# Patient Record
Sex: Female | Born: 1965 | Race: White | Hispanic: No | Marital: Married | Smoking: Current every day smoker
Health system: Southern US, Community
[De-identification: ages and names within clinical notes are randomized; demographics above are authoritative.]

## PROBLEM LIST (undated history)

## (undated) DIAGNOSIS — G4730 Sleep apnea, unspecified: Secondary | ICD-10-CM

## (undated) DIAGNOSIS — E079 Disorder of thyroid, unspecified: Secondary | ICD-10-CM

## (undated) HISTORY — PX: CARDIAC CATHETERIZATION: SHX172

## (undated) HISTORY — PX: FOOT SURGERY: SHX648

## (undated) HISTORY — DX: Sleep apnea, unspecified: G47.30

## (undated) HISTORY — DX: Disorder of thyroid, unspecified: E07.9

---

## 1997-09-16 ENCOUNTER — Ambulatory Visit (HOSPITAL_COMMUNITY): Admission: RE | Admit: 1997-09-16 | Discharge: 1997-09-16 | Payer: Self-pay | Admitting: Obstetrics and Gynecology

## 2000-12-12 ENCOUNTER — Encounter (INDEPENDENT_AMBULATORY_CARE_PROVIDER_SITE_OTHER): Payer: Self-pay

## 2000-12-12 ENCOUNTER — Ambulatory Visit (HOSPITAL_COMMUNITY): Admission: RE | Admit: 2000-12-12 | Discharge: 2000-12-12 | Payer: Self-pay | Admitting: Obstetrics and Gynecology

## 2001-07-21 ENCOUNTER — Inpatient Hospital Stay (HOSPITAL_COMMUNITY): Admission: AD | Admit: 2001-07-21 | Discharge: 2001-07-21 | Payer: Self-pay | Admitting: Obstetrics and Gynecology

## 2001-12-01 ENCOUNTER — Other Ambulatory Visit: Admission: RE | Admit: 2001-12-01 | Discharge: 2001-12-01 | Payer: Self-pay | Admitting: Obstetrics and Gynecology

## 2003-04-18 ENCOUNTER — Other Ambulatory Visit: Admission: RE | Admit: 2003-04-18 | Discharge: 2003-04-18 | Payer: Self-pay | Admitting: Obstetrics and Gynecology

## 2005-10-01 ENCOUNTER — Other Ambulatory Visit: Admission: RE | Admit: 2005-10-01 | Discharge: 2005-10-01 | Payer: Self-pay | Admitting: Obstetrics and Gynecology

## 2006-05-19 ENCOUNTER — Encounter: Admission: RE | Admit: 2006-05-19 | Discharge: 2006-05-19 | Payer: Self-pay | Admitting: Family Medicine

## 2006-06-11 ENCOUNTER — Encounter: Admission: RE | Admit: 2006-06-11 | Discharge: 2006-06-11 | Payer: Self-pay | Admitting: Obstetrics and Gynecology

## 2006-11-21 ENCOUNTER — Other Ambulatory Visit: Admission: RE | Admit: 2006-11-21 | Discharge: 2006-11-21 | Payer: Self-pay | Admitting: Obstetrics and Gynecology

## 2006-12-16 ENCOUNTER — Ambulatory Visit (HOSPITAL_COMMUNITY): Admission: RE | Admit: 2006-12-16 | Discharge: 2006-12-16 | Payer: Self-pay | Admitting: Cardiovascular Disease

## 2007-11-24 ENCOUNTER — Other Ambulatory Visit: Admission: RE | Admit: 2007-11-24 | Discharge: 2007-11-24 | Payer: Self-pay | Admitting: Obstetrics and Gynecology

## 2008-01-07 ENCOUNTER — Encounter: Admission: RE | Admit: 2008-01-07 | Discharge: 2008-01-07 | Payer: Self-pay | Admitting: Cardiology

## 2009-01-10 ENCOUNTER — Other Ambulatory Visit: Admission: RE | Admit: 2009-01-10 | Discharge: 2009-01-10 | Payer: Self-pay | Admitting: Obstetrics and Gynecology

## 2009-01-13 ENCOUNTER — Encounter: Admission: RE | Admit: 2009-01-13 | Discharge: 2009-01-13 | Payer: Self-pay | Admitting: Obstetrics and Gynecology

## 2010-08-15 ENCOUNTER — Ambulatory Visit: Payer: Self-pay | Admitting: Cardiology

## 2010-08-22 ENCOUNTER — Ambulatory Visit: Payer: Self-pay | Admitting: Cardiology

## 2010-09-24 ENCOUNTER — Inpatient Hospital Stay (HOSPITAL_COMMUNITY)
Admission: AD | Admit: 2010-09-24 | Discharge: 2010-09-24 | Disposition: A | Discharge status: Home / Self Care | Payer: 59 | Source: Ambulatory Visit | Attending: Obstetrics & Gynecology | Admitting: Obstetrics & Gynecology

## 2010-09-24 ENCOUNTER — Inpatient Hospital Stay (HOSPITAL_COMMUNITY): Payer: 59

## 2010-09-24 DIAGNOSIS — N949 Unspecified condition associated with female genital organs and menstrual cycle: Secondary | ICD-10-CM | POA: Insufficient documentation

## 2010-09-24 DIAGNOSIS — N938 Other specified abnormal uterine and vaginal bleeding: Secondary | ICD-10-CM | POA: Insufficient documentation

## 2010-09-24 DIAGNOSIS — N898 Other specified noninflammatory disorders of vagina: Secondary | ICD-10-CM

## 2010-09-24 LAB — URINALYSIS, ROUTINE W REFLEX MICROSCOPIC
Ketones, ur: 15 mg/dL — AB
Nitrite: POSITIVE — AB
Specific Gravity, Urine: <1.005 — ABNORMAL LOW (ref 1.005–1.030)
Urobilinogen, UA: 0.2 mg/dL (ref 0.0–1.0)
pH: 5 (ref 5.0–8.0)

## 2010-09-24 LAB — CBC
HCT: 37.7 % (ref 36.0–46.0)
MCH: 30.9 pg (ref 26.0–34.0)
MCV: 94.7 fL (ref 78.0–100.0)
Platelets: 273 10*3/uL (ref 150–400)
RDW: 14 % (ref 11.5–15.5)
WBC: 8.1 10*3/uL (ref 4.0–10.5)

## 2010-09-24 LAB — POCT PREGNANCY, URINE: Preg Test, Ur: NEGATIVE

## 2010-09-24 LAB — URINE MICROSCOPIC-ADD ON

## 2010-09-24 LAB — WET PREP, GENITAL
Trich, Wet Prep: NONE SEEN
Yeast Wet Prep HPF POC: NONE SEEN

## 2010-09-25 LAB — GC/CHLAMYDIA PROBE AMP, GENITAL
Chlamydia, DNA Probe: NEGATIVE
GC Probe Amp, Genital: NEGATIVE

## 2010-09-26 ENCOUNTER — Telehealth: Payer: Self-pay | Admitting: Cardiology

## 2010-09-26 NOTE — Telephone Encounter (Signed)
Pt called, testing system-encounter.

## 2010-09-27 ENCOUNTER — Other Ambulatory Visit: Payer: Self-pay | Admitting: Cardiology

## 2010-09-27 MED ORDER — LEVOTHYROXINE SODIUM 25 MCG PO TABS: 25.0000 ug | ORAL_TABLET | Freq: Every day | ORAL | Status: DC

## 2010-09-27 NOTE — Telephone Encounter (Signed)
Patient requested refill

## 2010-09-27 NOTE — Telephone Encounter (Signed)
Patient needs refill of [facility]roid [facility].  Please update script for #90 day supply.

## 2010-11-07 ENCOUNTER — Telehealth: Payer: Self-pay | Admitting: Cardiology

## 2010-11-07 NOTE — Telephone Encounter (Signed)
test

## 2010-11-13 NOTE — Cardiovascular Report (Signed)
NAMEERRICA, DUTIL NO.:  [id number]   MEDICAL RECORD NO.:  [id number]          PATIENT TYPE:  OIB   LOCATION:  2864                         FACILITY:  [facility]   PHYSICIAN:  Vesta Mixer, M.D. DATE OF BIRTH:  [birth year]   DATE OF PROCEDURE:  12/16/2006  DATE OF DISCHARGE:  12/16/2006                            CARDIAC CATHETERIZATION   Kayla Graham is a 45 year old female who started having chest pain  this morning.  She had some subtle EKG changes.  She has a history of  dyslipidemia, a remote history of cigarette smoking, and a strong family  history of coronary disease.  She is referred for heart catheterization,  based on these findings.   The right femoral artery was easily cannulated, using the modified  Seldinger technique.   HEMODYNAMIC RESULTS:  The LV pressure is 121/15 with an aortic pressure  122/83.   ANGIOGRAPHY:  Left main.  The left main is extremely short but is very  large and normal.   The left anterior descending artery is fairly normal.  It is a moderate-  sized vessel.  It is fairly smooth and normal throughout its course.   The first diagonal artery is fairly large and is otherwise normal.   The left circumflex artery is large and dominant.  It gives off a large  first obtuse marginal artery, which is normal.  The second obtuse  marginal artery/posterolateral branch is normal.  The terminal  circumflex artery and posterior descending artery are normal.   The right coronary artery is small and is non-dominant.  It supplies RV  marginal branches only.  It is smooth and normal.   The left ventriculogram was performed in a 30 RAO position.  It reveals  normal left ventricular systolic function with an ejection fraction of  65%.  There is no mitral regurgitation.   COMPLICATIONS:  None.   CONCLUSIONS:  1. Smooth and normal coronary arteries.  2. Normal left ventricular systolic function.     ______________________________  Vesta Mixer, M.D.     PJN/MEDQ  D:  01/20/2007  T:  01/20/2007  Job:  [id number]

## 2010-11-13 NOTE — H&P (Signed)
NAMESHATERRA, SANZONE NO.:  [id number]   MEDICAL RECORD NO.:  [id number]           PATIENT TYPE:   LOCATION:                                 FACILITY:   PHYSICIAN:  Vesta Mixer, M.D. DATE OF BIRTH:  02-Apr-1966   DATE OF ADMISSION:  12/16/2006  DATE OF DISCHARGE:                              HISTORY & PHYSICAL   HISTORY AND PHYSICAL   SUBJECTIVE:  (Jelena)__________Lester is a 45 year old female who is  admitted to the hospital for episodes of chest pain.  She is scheduled  for heart catheterization.   The patient has a long history of low HCL and dyslipidemia.  She has a  very strong family history of coronary artery disease with her mother  having a myocardial infarction and coronary artery bypass grafting in  her 24's.  She also has a history of cigarette smoking.  She started  having some chest pains this morning.  This chest pain started while she  was carrying a load of laundry up the stairs.  The pain was 5/10 and  radiated through to her back and down her left arm.  It was associated  also with some left arm numbness as well as some diaphoresis and some  shortness of breath.  It lasted for about an hour or so and then seemed  to resolve.  We gave her a nitroglycerin, but it is not clear whether  the pain resolved spontaneously or with the nitroglycerin.  She states  that she has been having these pains off and on for many years.  She has  thought that they may be due to esophageal reflux or other GI etiology.  She does not get any regular exercise.  She states that these typically  occur at various times, but does note that exercise seems to bring them  on fairly often.  She denies any syncope or presyncope.  She denies any  PND or orthopnea.  She denies any hemoptysis, cough or fever.   CURRENT MEDICATIONS:  None.   ALLERGIES:  None.   PAST MEDICAL HISTORY:  None.   SOCIAL HISTORY:  The patient used to smoke but quit 1 1/2 years ago.  She  does not drink alcohol.   FAMILY HISTORY:  Strongly positive for coronary artery disease in her  mother.   REVIEW OF SYSTEMS:  Review of systems is reviewed and is essentially  negative except for as noted in the HPI.   PHYSICAL EXAMINATION:  GENERAL:  She is a young White female in no acute  distress.  She does complain of having some mild 4/10 chest pain.  Her  weight is 218.  VITAL SIGNS:  Blood pressure initially was 154/100.  Several minutes  later it was 132/90.  Her heart rate is 80.  HEENT EXAM:  Reveals 2+ carotid.  She has no bruits, no JVD, no  thyromegaly.  LUNGS:  Clear to auscultation.  HEART:  Regular rate S1, S2.  ABDOMINAL EXAM:  Reveals good bowel sounds and is nontender.  EXTREMITIES:  She has no clubbing, cyanosis or edema.  NEURO  EXAM:  Nonfocal.  Her femoral pulses are somewhat deep.  Her  distal pulses are normal.   Her EKG reveals normal sinus rhythm.  She has nonspecific ST and T-wave  changes.   IMPRESSION AND PLAN:  Chest pain.  The patient has multiple risk factors  for coronary artery disease.  Her mother had significant coronary artery  disease in  her 40's.  In addition, she has low HCL and history of  cigarette smoking.  I have recommended that we proceed with heart  catheterization for further evaluation.  We have discussed the risks and  benefits and options of heart catheterization.  She understands and  agrees to proceed.   Other possible etiologies include a hiatal hernia or gastroesophageal  reflux disease.  My preference would be to rule out any sort of cardiac  disease and then proceed with a GI work-up if indicated.  The patient is  now pain free after receiving nitroglycerin.  We will schedule her for a  heart catheterization today.           ______________________________  Vesta Mixer, M.D.     PJN/MEDQ  D:  12/16/2006  T:  12/16/2006  Job:  [id number]

## 2010-11-16 NOTE — Op Note (Signed)
[facility]  Patient:    Kayla Graham, Kayla Graham                      MRN: [id number] Proc. Date: 12/12/00 Adm. Date:  [id number] Attending:  Leonard Schwartz                           Operative Report  PREOPERATIVE DIAGNOSIS:       Irregular bleeding.  POSTOPERATIVE DIAGNOSES:      1. Irregular bleeding.                               2. Uterine polyps.  PROCEDURE:                    Diagnostic hysteroscopy with dilatation and                               curettage.  SURGEON:                      Janine Limbo, M.D.  ANESTHESIA:                   General.  DISPOSITION:                  [name] is a 45 year old female who presents with irregular bleeding. She understands the indications for her procedure and she accepts the risk of, but not limited to, anesthetic complications, bleeding, infections, and possible damage to the surrounding organs.  FINDINGS:                     A moderate amount of endometrial tissue was present. Polyps were noted within the endometrial cavity. The postoperative cavity was clean.  ESTIMATED BLOOD LOSS:         30 cc.  DESCRIPTION OF PROCEDURE:     The patient was taken to the operating room where a general anesthetic was given. The patients perineum and vagina were prepped with multiple layers of Betadine. The bladder was drained of urine. The patient was sterilely draped. Examination under anesthesia was performed. A paracervical block was placed using 10 cc of 0.5% Marcaine without epinephrine and endocervical curettage was obtained. The cervix was sounded to 9 cm. The cervix was then gradually dilated. The uterine cavity was explored using the diagnostic hysteroscope. Pictures were taken. The hysteroscope was removed and the cavity was curetted until it was felt to be clean. The hysteroscope was reinserted and the cavity was then documented to be clean. All instruments were removed. The estimated  blood loss was approximately 30 cc. The patient tolerated her procedure well. The deficit was Sorbitol was thought to be zero. The patient was awakened from her anesthetic and taken to the recovery room in stable condition.  FOLLOWUP INSTRUCTIONS:          The patient will return to see Dr. Stefano Gaul in two to three weeks for followup examination. She was given a prescription for Darvocet-N 100, one tablet every 10 to 12 hours as needed for pain. She will return sooner should she have questions or concerns. She was given a copy of the postoperative instruction sheet as prepared by the [facility] for patients who have undergone a dilatation and curettage. DD:  12/12/00 TD:  12/12/00 Job: [id number]

## 2010-11-16 NOTE — H&P (Signed)
[facility]  Patient:    Kayla Graham, Kayla Graham                        MRN: [id number] Adm. Date:  12/12/00 Attending:  Janine Limbo, M.D.                         History and Physical  HISTORY OF PRESENT ILLNESS:   The patient is a 45 year old female, para 2-0-0-3, who presents for hysteroscopy and D&C because of irregular vaginal bleeding. We attempted a hydrosonogram and this was unsuccessful. The patient had an ultrasound performed, and there was a question of an endometrial polyp. The endometrium was thickened at 1.8 cm. The uterus measured 11.2 x 5.3 x 6 cm. The patient has had an abnormal Pap smear in the past. She denies a history of sexually-transmitted infections. She has had a cesarean section in the past.  OBSTETRICAL HISTORY:          The patient has had twins and one vaginal delivery.  DRUG ALLERGIES:               None known.  PAST MEDICAL HISTORY:         The patient has a history of anemia during pregnancy. She has taken Prozac in the past for depression. She denies other major medical illnesses.  SOCIAL HISTORY:               The patient drinks alcohol socially. She denies cigarette use and other recreational drug uses.  REVIEW OF SYSTEMS:            Noncontributory.  FAMILY HISTORY:               The patients mother has hypertension and heart disease.  PHYSICAL EXAMINATION:  VITAL SIGNS:                  Weight is 186 pounds.  HEENT:                        Within normal limits.  CHEST:                        Clear.  HEART:                        Regular rate and rhythm.  BREASTS:                      Without masses.  ABDOMEN:                      Nontender.  EXTREMITIES:                  Within normal limits.  NEUROLOGICAL:                 Grossly normal.  PELVIC:                       External genitalia is normal. The vagina is normal. Cervix is nontender. The uterus is upper limit of normal size. Adnexa no  masses.  ASSESSMENT:                   Irregular vaginal bleeding.  PLAN:  The patient will undergo a hysteroscopy and dilatation and curettage. She understands the indications for her procedure, and she accepts the risks of, but not limited to, anesthetic complications, bleeding, infections, and possible damage to the surrounding organs. DD:  12/11/00 TD:  12/11/00 Job: [id number]

## 2010-11-16 NOTE — H&P (Signed)
NAMEEMMELY, BITTINGER NO.:  [id number]   MEDICAL RECORD NO.:  [id number]          PATIENT TYPE:  OIB   LOCATION:  2864                         FACILITY:  [facility]   PHYSICIAN:  Vesta Mixer, M.D. DATE OF BIRTH:  06/28/1966   DATE OF ADMISSION:  12/16/2006  DATE OF DISCHARGE:                              HISTORY & PHYSICAL   HISTORY OF PRESENT ILLNESS:  Kayla Graham is a 45 year old female with  onset of chest pain this morning.  She has a history of dyslipidemia  with a low HDL.  She is admitted for heart catheterization for further  evaluation.   The patient has a strong family history of coronary artery disease.  She  has a history of cigarette smoking in the past.   The chest pain started the morning of December 16, 2006.  She had just  brought a load of laundry up the stairs.  The chest pain was described  as a 5/10.  It lasted for several hours.  It radiated through to her  back  and was associated with some shortness breath.  There was also  some question of radiation up to her neck and down her arm.   She got several nitroglycerin, and the chest eventually resolved either  spontaneously or with a nitroglycerin.   The patient has not had any similar episodes in the past.  She does not  get any regular exercise, but has not had any episodes of chest pain  with her normal activities.  She denies any syncope or presyncope.  She  denies any nausea, vomiting or diaphoresis.   CURRENT MEDICATIONS:  None.   ALLERGIES:  None.   PAST MEDICAL HISTORY:  None.   SOCIAL HISTORY:  The patient quit smoking one and a half years ago.  She  does not drink alcohol.   FAMILY HISTORY:  Positive coronary artery disease in an early age in her  mother.   REVIEW OF SYSTEMS:  Reviewed and is essentially negative except for as  noted in HPI.   PHYSICAL EXAMINATION:  GENERAL:  She is a young female.  She still has  mild chest pain.  VITAL SIGNS:  Weight is 218.  Initial  blood pressure was 154/100.  Later  was 132/90.  Heart rate 80.  LUNGS:  Clear.  HEART:  Regular rate S1-S2.  ABDOMEN:  Reveals good bowel sounds and is nontender.  EXTREMITIES:  She has no clubbing, cyanosis or edema.  She has good  pulses.  NEUROLOGICAL:  Nonfocal.   STUDIES:  Her EKG reveals normal sinus rhythm.  She has nonspecific ST-T  wave abnormalities.   IMPRESSION AND PLAN:  Chest pain.  The patient presents with sudden  onset of chest pain this morning.  She has several risk factors  including dyslipidemia, history of cigarette smoking and a strong family  history of coronary  artery disease.  We have discussed the risks, benefits and options  concerning heart catheterization.  She understands and agrees to  proceed.  Other possible etiologies include hiatal hernia or esophageal  spasm.  We will proceed  with heart catheterization.           ______________________________  Vesta Mixer, M.D.     PJN/MEDQ  D:  01/20/2007  T:  01/20/2007  Job:  [id number]

## 2011-01-24 ENCOUNTER — Other Ambulatory Visit (HOSPITAL_COMMUNITY): Payer: Self-pay | Admitting: Orthopedic Surgery

## 2011-01-24 DIAGNOSIS — R52 Pain, unspecified: Secondary | ICD-10-CM

## 2011-01-25 ENCOUNTER — Ambulatory Visit (HOSPITAL_COMMUNITY)
Admission: RE | Admit: 2011-01-25 | Discharge: 2011-01-25 | Disposition: A | Discharge status: Home / Self Care | Payer: 59 | Source: Ambulatory Visit | Attending: Orthopedic Surgery | Admitting: Orthopedic Surgery

## 2011-01-25 DIAGNOSIS — M25579 Pain in unspecified ankle and joints of unspecified foot: Secondary | ICD-10-CM | POA: Insufficient documentation

## 2011-01-25 DIAGNOSIS — R609 Edema, unspecified: Secondary | ICD-10-CM | POA: Insufficient documentation

## 2011-01-25 DIAGNOSIS — R52 Pain, unspecified: Secondary | ICD-10-CM

## 2011-01-25 DIAGNOSIS — M6740 Ganglion, unspecified site: Secondary | ICD-10-CM | POA: Insufficient documentation

## 2011-02-21 ENCOUNTER — Other Ambulatory Visit: Payer: Self-pay | Admitting: *Deleted

## 2011-02-21 DIAGNOSIS — R0683 Snoring: Secondary | ICD-10-CM

## 2011-02-22 ENCOUNTER — Ambulatory Visit (HOSPITAL_BASED_OUTPATIENT_CLINIC_OR_DEPARTMENT_OTHER): Payer: 59 | Attending: Cardiology

## 2011-02-22 DIAGNOSIS — R0683 Snoring: Secondary | ICD-10-CM

## 2011-02-22 DIAGNOSIS — G4733 Obstructive sleep apnea (adult) (pediatric): Secondary | ICD-10-CM | POA: Insufficient documentation

## 2011-02-25 ENCOUNTER — Encounter: Payer: Self-pay | Admitting: *Deleted

## 2011-02-28 DIAGNOSIS — G4733 Obstructive sleep apnea (adult) (pediatric): Secondary | ICD-10-CM

## 2011-02-28 NOTE — Procedures (Signed)
NAMEAUSTIN, Kayla Graham NO.:  [id number]  MEDICAL RECORD NO.:  [id number]          PATIENT TYPE:  OUT  LOCATION:  SLEEP CENTER                 FACILITY:  [facility]  PHYSICIAN:  Barbaraann Share, MD,FCCPDATE OF BIRTH:  14-Dec-1965  DATE OF STUDY:  02/22/2011                           NOCTURNAL POLYSOMNOGRAM  REFERRING PHYSICIAN:  Cassell Clement, M.D.  INDICATION FOR STUDY:  Hypersomnia with sleep apnea.  EPWORTH SLEEPINESS SCORE:  8.  SLEEP ARCHITECTURE:  The patient had a total sleep time of 347 minutes with decreased quantity of REM and also slow wave sleep.  Sleep onset latency was normal at 4.5 minutes, and REM onset was at the upper limits of normal at 112 minutes.  Sleep efficiency was minimally reduced in both the diagnostic and titration portion of the study.  RESPIRATORY DATA:  The patient underwent split night protocol where she was found to have 56 obstructive events in the first 120 minutes of sleep.  This gave her an apnea/hypopnea index of 28 events per hour. The events occurred in all body positions, and there was loud snoring noted throughout.  She was then fitted with a medium Quattro Fx full- face mask per split night protocol, and ultimately titrated to an optimal pressure of 8 cm of water with good control of both obstructive events and snoring.  OXYGEN DATA:  There was O2 desaturation as low as 75% with the patient's obstructive events.  CARDIAC DATA:  No clinically significant arrhythmias were noted.  MOVEMENT-PARASOMNIA:  The patient had no significant leg jerks or other abnormal behavior seen.  IMPRESSION-RECOMMENDATIONS:  Split night study reveals moderate obstructive sleep apnea with an AH I of 28 events per hour and O2 desaturation as low as 75%.  The patient was then fitted with a medium Quattro FX full-face mask, and titrated to an optimal pressure of 8 cm of water.  She should also be encouraged to work aggressively on  weight loss.    Barbaraann Share, MD,FCCP Diplomate, American Board of Sleep Medicine Electronically Signed   [facility]/MEDQ  D:  02/28/2011 15:01:12  T:  02/28/2011 18:53:44  Job:  [id number]

## 2011-03-28 ENCOUNTER — Encounter: Payer: Self-pay | Admitting: Pulmonary Disease

## 2011-03-28 ENCOUNTER — Ambulatory Visit (INDEPENDENT_AMBULATORY_CARE_PROVIDER_SITE_OTHER): Payer: 59 | Admitting: Pulmonary Disease

## 2011-03-28 VITALS — BP 118/82 | HR 77 | Temp 98.5°F | Ht 65.0 in | Wt 226.6 lb

## 2011-03-28 DIAGNOSIS — G4733 Obstructive sleep apnea (adult) (pediatric): Secondary | ICD-10-CM

## 2011-03-28 NOTE — Assessment & Plan Note (Signed)
Initiate  CPAP of 8 cm with a full face quattro mask. - Expect to see some improvement in Kayla Graham daytime tiredness and snoring. Weight loss encouraged, compliance with goal of at least 4-6 hrs every night is the expectation. Advised against medications with sedative side effects Cautioned against driving when sleepy - understanding that sleepiness will vary on a day to day basis The pathophysiology of obstructive sleep apnea , it's cardiovascular consequences & modes of treatment including CPAP were discused with the patient in detail & they evidenced understanding.

## 2011-03-28 NOTE — Progress Notes (Signed)
  Subjective:    Patient ID: Kayla Graham, female    DOB: 09/30/1965, 45 y.o.   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 c/o loud snoring, husband states she stops breathing and choke in sleep. Wake up with a sore throat ESS 7/24 , her complaint is mainly tiredness. Bedtime is around 10 PM, sleep latency is 15-30 minutes, she sleeps on her side with 2 pillows. Husband has noted increased snoring when she lies on her back. She has 3-4 awakenings, gets out of bed at 5:30 AM feeling somewhat tired with occasional dryness of mouth, no headaches. She has for 45 minute drive to work, drinks 2 cups of coffee every morning and another mug on route. She has gained 15 pounds in the last 2 years. Her cardiac profile is okay. There is no history suggestive of cataplexy, sleep paralysis or parasomnias  PSG reviewed 8/12 showed moderate OSA with AHI 28/h & nadir satn of 75% TST 347 mins. This was corrected with CPAP of 8 cm with a full face quattro mask   Review of Systems  Constitutional: Negative for fever and unexpected weight change.  HENT: Positive for congestion, sore throat and sneezing. Negative for ear pain, nosebleeds, rhinorrhea, trouble swallowing, dental problem, postnasal drip and sinus pressure.   Eyes: Positive for redness and itching.  Respiratory: Positive for cough and shortness of breath. Negative for chest tightness and wheezing.   Cardiovascular: Negative for palpitations and leg swelling.  Gastrointestinal: Negative for nausea and vomiting.  Genitourinary: Negative for dysuria.  Musculoskeletal: Positive for arthralgias. Negative for joint swelling.  Skin: Negative for rash.  Neurological: Negative for headaches.  Hematological: Does not bruise/bleed easily.  Psychiatric/Behavioral: Negative for dysphoric mood. The patient is not nervous/anxious.        Objective:   Physical Exam  Gen.  Pleasant, well-nourished, in no distress ENT - no lesions, no post nasal drip Neck: No JVD, no thyromegaly, no carotid bruits Lungs: no use of accessory muscles, no dullness to percussion, clear without rales or rhonchi  Cardiovascular: Rhythm regular, heart sounds  normal, no murmurs or gallops, no peripheral edema Musculoskeletal: No deformities, no cyanosis or clubbing        Assessment & Plan:

## 2011-03-28 NOTE — Patient Instructions (Signed)
Rx sent to DME for CPAP machine set at 8 cm with full face mask Send in the card before your FU appt

## 2011-05-14 ENCOUNTER — Ambulatory Visit: Payer: 59 | Admitting: Pulmonary Disease

## 2011-06-02 ENCOUNTER — Telehealth: Payer: Self-pay | Admitting: Pulmonary Disease

## 2011-06-02 NOTE — Telephone Encounter (Signed)
Download 10/4-30/12 >> no residuall events on CPAP, no leak, good compliance Hope she is feeling better

## 2011-06-03 NOTE — Telephone Encounter (Signed)
I informed pt of RA's findings and recommendations. Pt verbalized understanding. Pt states is doing better. She and husband are both sleeping well.

## 2011-06-21 ENCOUNTER — Encounter: Payer: Self-pay | Admitting: Pulmonary Disease

## 2011-06-21 ENCOUNTER — Ambulatory Visit (INDEPENDENT_AMBULATORY_CARE_PROVIDER_SITE_OTHER): Payer: 59 | Admitting: Pulmonary Disease

## 2011-06-21 VITALS — BP 120/90 | HR 81 | Temp 98.0°F | Ht 65.0 in | Wt 233.2 lb

## 2011-06-21 DIAGNOSIS — G4733 Obstructive sleep apnea (adult) (pediatric): Secondary | ICD-10-CM

## 2011-06-21 NOTE — Progress Notes (Signed)
  Subjective:    Patient ID: Kayla Graham, female    DOB: Jan 30, 1966, 45 y.o.   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  c/o loud snoring, husband states she stops breathing and choke in sleep. Wake up with a sore throat  ESS 7/24 , her complaint is mainly tiredness.  Bedtime is around 10 PM, sleep latency is 15-30 minutes, she sleeps on her side with 2 pillows. Husband has noted increased snoring when she lies on her back. She has 3-4 awakenings, gets out of bed at 5:30 AM feeling somewhat tired with occasional dryness of mouth, no headaches. She has a 45 minute drive to work, drinks 2 cups of coffee every morning and another mug on route. She has gained 15 pounds in the last 2 years. Her cardiac profile is okay.  PSG reviewed 8/12 showed moderate OSA with AHI 28/h & nadir satn of 75% TST 347 mins. This was corrected with CPAP of 8 cm with a full face quattro mask  Download 10/4-30/12 >> no residuall events on CPAP, no leak, good compliance  06/21/2011 download 10/12 >> excellent compliance on 8cm, no residuals, no leak Mask ok, pressure ok, waking up refreshed, less tiredness, mild to no snoring per pt report from husband - tolerating cpap well     Review of Systems    Patient denies significant dyspnea,cough, hemoptysis,  chest pain, palpitations, pedal edema, orthopnea, paroxysmal nocturnal dyspnea, lightheadedness, nausea, vomiting, abdominal or  leg pains   Objective:   Physical Exam  Gen. Pleasant, well-nourished, in no distress ENT - no lesions, no post nasal drip, class 2 airway Neck: No JVD, no thyromegaly, no carotid bruits Lungs: no use of accessory muscles, no dullness to percussion, clear without rales or rhonchi  Cardiovascular: Rhythm regular, heart sounds  normal, no murmurs or gallops, no peripheral edema Musculoskeletal: No deformities, no cyanosis or clubbing         Assessment & Plan:

## 2011-06-21 NOTE — Patient Instructions (Signed)
Stay on CPAP 8 cm  You are doing fabulous

## 2011-06-22 NOTE — Assessment & Plan Note (Signed)
Tolerating cpap well with good results & compliance. Weight loss encouraged, compliance with goal of at least 4-6 hrs every night is the expectation. Advised against medications with sedative side effects Cautioned against driving when sleepy - understanding that sleepiness will vary on a day to day basis Discussed care of CPAP & supplies.

## 2011-09-12 ENCOUNTER — Other Ambulatory Visit: Payer: Self-pay | Admitting: *Deleted

## 2011-09-12 ENCOUNTER — Other Ambulatory Visit (INDEPENDENT_AMBULATORY_CARE_PROVIDER_SITE_OTHER): Payer: 59

## 2011-09-12 DIAGNOSIS — E079 Disorder of thyroid, unspecified: Secondary | ICD-10-CM

## 2011-09-13 LAB — TSH: TSH: 3.34 u[IU]/mL (ref 0.35–5.50)

## 2012-01-03 ENCOUNTER — Encounter: Payer: Self-pay | Admitting: Pulmonary Disease

## 2012-01-03 ENCOUNTER — Ambulatory Visit (INDEPENDENT_AMBULATORY_CARE_PROVIDER_SITE_OTHER): Payer: 59 | Admitting: Pulmonary Disease

## 2012-01-03 VITALS — BP 136/86 | HR 97 | Temp 97.6°F | Ht 65.0 in | Wt 231.2 lb

## 2012-01-03 DIAGNOSIS — G4733 Obstructive sleep apnea (adult) (pediatric): Secondary | ICD-10-CM

## 2012-01-03 NOTE — Progress Notes (Signed)
  Subjective:    Patient ID: Kayla Graham, female    DOB: [birth year], 46 y.o.   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   PSG reviewed 8/12 showed moderate OSA with AHI 28/h & nadir satn of 75% TST 347 mins. This was corrected with CPAP of 8 cm with a full face quattro mask  Download 10/4-30/12 >> no residuall events on CPAP, no leak, good compliance  06/21/2011  download 10/12 >> excellent compliance on 8cm, no residuals, no leak    01/03/2012 Patient states about the same as last visit. Patient states wears cpap every night 6 to 8 hours. c/o red lines on her face from new mask. states the mask is not tight.  Also states fell asleep x 1 day ago without her cpap mask on  and woke up gasping for air.  Mask ok, pressure ok, waking up refreshed, less tiredness, mild to no snoring per pt report from husband - tolerating cpap well Wt about the same   Review of Systems Patient denies significant dyspnea,cough, hemoptysis,  chest pain, palpitations, pedal edema, orthopnea, paroxysmal nocturnal dyspnea, lightheadedness, nausea, vomiting, abdominal or  leg pains      Objective:   Physical Exam  Gen. Pleasant, well-nourished, in no distress ENT - no lesions, no post nasal drip Neck: No JVD, no thyromegaly, no carotid bruits Lungs: no use of accessory muscles, no dullness to percussion, clear without rales or rhonchi  Cardiovascular: Rhythm regular, heart sounds  normal, no murmurs or gallops, no peripheral edema Musculoskeletal: No deformities, no cyanosis or clubbing        Assessment & Plan:

## 2012-01-03 NOTE — Patient Instructions (Addendum)
Trial of vaseline, change fit Change of full face mask If above does not work, trial of nasal pillow

## 2012-01-06 NOTE — Assessment & Plan Note (Signed)
ModOSa, stable & comlpiant with CPAP 8 cm Trial of vaseline, change fit Change of full face mask If above does not work, trial of nasal pillow  Weight loss encouraged, compliance with goal of at least 4-6 hrs every night is the expectation. Advised against medications with sedative side effects Cautioned against driving when sleepy - understanding that sleepiness will vary on a day to day basis

## 2012-02-21 ENCOUNTER — Encounter: Payer: Self-pay | Admitting: *Deleted

## 2012-02-21 ENCOUNTER — Ambulatory Visit (INDEPENDENT_AMBULATORY_CARE_PROVIDER_SITE_OTHER): Payer: 59 | Admitting: *Deleted

## 2012-02-21 VITALS — BP 130/81 | HR 79 | Ht 64.0 in | Wt 230.0 lb

## 2012-02-21 DIAGNOSIS — R0602 Shortness of breath: Secondary | ICD-10-CM

## 2012-02-21 DIAGNOSIS — R079 Chest pain, unspecified: Secondary | ICD-10-CM

## 2012-02-21 MED ORDER — TRAZODONE HCL 100 MG PO TABS: 100.0000 mg | ORAL_TABLET | Freq: Every evening | ORAL | Status: DC | PRN

## 2012-02-21 MED ORDER — NITROGLYCERIN 0.4 MG SL SUBL: 0.4000 mg | SUBLINGUAL_TABLET | Freq: Once | SUBLINGUAL | Status: AC

## 2012-02-21 MED ORDER — NITROGLYCERIN 0.4 MG SL SUBL: 0.4000 mg | SUBLINGUAL_TABLET | SUBLINGUAL | Status: DC | PRN

## 2012-02-21 MED ORDER — RANITIDINE HCL 150 MG PO TABS: 150.0000 mg | ORAL_TABLET | Freq: Two times a day (BID) | ORAL | Status: DC

## 2012-02-21 MED ORDER — PROPRANOLOL HCL ER 60 MG PO CP24: 60.0000 mg | ORAL_CAPSULE | Freq: Every evening | ORAL | Status: DC

## 2012-02-21 NOTE — Patient Instructions (Addendum)
   Zantac 150mg  twice a day  - OTC   Nitroglycerin as needed for severe chest pain only  Trazodone 100mg  - begin with 50mg  as needed for sleep  Inderal LA 60mg  every evening - take this until 9/5 & can discuss need to continue at that time   Keep appointment already scheduled   Off rest of the day

## 2012-02-27 ENCOUNTER — Encounter: Payer: Self-pay | Admitting: Cardiovascular Disease

## 2012-02-27 ENCOUNTER — Ambulatory Visit (INDEPENDENT_AMBULATORY_CARE_PROVIDER_SITE_OTHER): Payer: 59 | Admitting: Cardiovascular Disease

## 2012-02-27 VITALS — BP 142/80 | HR 86 | Ht 65.0 in | Wt 231.2 lb

## 2012-02-27 DIAGNOSIS — R0789 Other chest pain: Secondary | ICD-10-CM

## 2012-02-27 DIAGNOSIS — R079 Chest pain, unspecified: Secondary | ICD-10-CM

## 2012-02-27 NOTE — Assessment & Plan Note (Signed)
Kayla Graham presents today with episodes of chest tightness, left shoulder pain, left arm tingling and numbness, and some shortness of breath/inability to catch her breath. These episodes occur at various times and last for several minutes. They are not associated with exercise. They are also not associated with any specific activities such as eating, drinking, change of position    .   She thinks that they may be related to stress.    She's had a normal heart catheterization in 2008.  I agree that her episodes are likely due to stress.  She has some mild abnormalities on her ECG and I think we need to rule out CAD with a 2 day Lexiscan Myoview study.    I offered to send to to see several people that may be able to help her with her stress.  Emiliano Dyer [phone] ( psychologist) Orbie Hurst [phone] ( wellness , biofeedback nurse)  I will see her as needed.
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
## 2012-02-27 NOTE — Patient Instructions (Addendum)
Your physician has requested that you have a lexiscan myoview. For further information please visit [email or web address]. Please follow instruction sheet, as given.  Your physician wants you to follow-up as needed with Dr. Nahser. You will receive a reminder letter in the mail two months in advance. If you don't receive a letter, please call our office to schedule the follow-up appointment.  

## 2012-03-05 ENCOUNTER — Ambulatory Visit: Payer: 59 | Admitting: Nurse Practitioner

## 2012-03-05 ENCOUNTER — Other Ambulatory Visit: Payer: Self-pay | Admitting: Family Medicine

## 2012-03-05 ENCOUNTER — Ambulatory Visit (HOSPITAL_COMMUNITY)
Admission: RE | Admit: 2012-03-05 | Discharge: 2012-03-05 | Disposition: A | Discharge status: Home / Self Care | Payer: 59 | Source: Ambulatory Visit | Attending: Family Medicine | Admitting: Family Medicine

## 2012-03-05 DIAGNOSIS — R0600 Dyspnea, unspecified: Secondary | ICD-10-CM

## 2012-03-05 DIAGNOSIS — R0989 Other specified symptoms and signs involving the circulatory and respiratory systems: Secondary | ICD-10-CM | POA: Insufficient documentation

## 2012-03-05 DIAGNOSIS — R0609 Other forms of dyspnea: Secondary | ICD-10-CM | POA: Insufficient documentation

## 2012-03-09 ENCOUNTER — Other Ambulatory Visit (HOSPITAL_COMMUNITY): Payer: 59

## 2012-03-09 ENCOUNTER — Ambulatory Visit (HOSPITAL_COMMUNITY): Payer: 59 | Attending: Internal Medicine | Admitting: Radiology

## 2012-03-09 VITALS — BP 125/83 | Ht 65.0 in | Wt 231.0 lb

## 2012-03-09 DIAGNOSIS — Z87891 Personal history of nicotine dependence: Secondary | ICD-10-CM | POA: Insufficient documentation

## 2012-03-09 DIAGNOSIS — R079 Chest pain, unspecified: Secondary | ICD-10-CM | POA: Insufficient documentation

## 2012-03-09 DIAGNOSIS — R0602 Shortness of breath: Secondary | ICD-10-CM

## 2012-03-09 DIAGNOSIS — R0609 Other forms of dyspnea: Secondary | ICD-10-CM | POA: Insufficient documentation

## 2012-03-09 DIAGNOSIS — R55 Syncope and collapse: Secondary | ICD-10-CM | POA: Insufficient documentation

## 2012-03-09 DIAGNOSIS — R42 Dizziness and giddiness: Secondary | ICD-10-CM | POA: Insufficient documentation

## 2012-03-09 DIAGNOSIS — R0989 Other specified symptoms and signs involving the circulatory and respiratory systems: Secondary | ICD-10-CM | POA: Insufficient documentation

## 2012-03-09 DIAGNOSIS — Z8249 Family history of ischemic heart disease and other diseases of the circulatory system: Secondary | ICD-10-CM | POA: Insufficient documentation

## 2012-03-09 DIAGNOSIS — I10 Essential (primary) hypertension: Secondary | ICD-10-CM | POA: Insufficient documentation

## 2012-03-09 MED ORDER — REGADENOSON 0.4 MG/5ML IV SOLN
0.4000 mg | Freq: Once | INTRAVENOUS | Status: AC
  Administered 2012-03-09: 0.4 mg via INTRAVENOUS

## 2012-03-09 MED ORDER — TECHNETIUM TC 99M SESTAMIBI GENERIC - CARDIOLITE
33.0000 | Freq: Once | INTRAVENOUS | Status: AC | PRN
  Administered 2012-03-09: 33 via INTRAVENOUS

## 2012-03-09 NOTE — Progress Notes (Deleted)
[facility] SITE 3 NUCLEAR MED [address] [place] [address] [phone]  Cardiology Nuclear Med Study  Kayla Graham is a 46 y.o. female     MRN : [id number]     DOB: [birth year]  Procedure Date: 03/09/2012  Nuclear Med Background Indication for Stress Test:  Evaluation for Ischemia History:  Abnormal EKG:8/13 Nonspecific ST abnormalities(no change since 2008) and '08 Heart Catheterization: EF= 65% and normal coronaries Cardiac Risk Factors: Family History - CAD, History of Smoking and Hypertension  Symptoms:  Chest Pain/Tightness at Rest and  with Exertion (last date of chest discomfort/tightness yesterday), Diaphoresis, Dizziness, DOE, Fatigue, Near Syncope and SOB   Nuclear Pre-Procedure Caffeine/Decaff Intake:  9:00pm NPO After: 9:00pm   Lungs:  clear O2 Sat: 96% on room air. IV 0.9% NS with Angio Cath:  20g  IV Site: R Hand  IV Started by:  Cathlyn Parsons, RN  Chest Size (in):  40 Cup Size: D  Height: 5\' 5"  (1.651 m)  Weight:  231 lb (104.781 kg)  BMI:  Body mass index is 38.44 kg/(m^2). Tech Comments:  Inderal held x 36 hrs    Nuclear Med Study 1 or 2 day study: 2 day  Stress Test Type:  Treadmill/Lexiscan  Reading MD: Dietrich Pates, MD  Order Authorizing Provider:  Jannette Spanner  Resting Radionuclide: Technetium 50m Sestamibi  Resting Radionuclide Dose: *** mCi   Stress Radionuclide:  Technetium 26m Sestamibi  Stress Radionuclide Dose: *** mCi           Stress Protocol Rest HR: 67 Stress HR: 101  Rest BP: 125/83 Stress BP: 134/93  Exercise Time (min): 2:00 METS: 1.6   Predicted Max HR: 174 bpm % Max HR: 58.05 bpm Rate Pressure Product: 21308   Dose of Adenosine (mg):  n/a Dose of Lexiscan: 0.4 mg  Dose of Atropine (mg): n/a Dose of Dobutamine: n/a mcg/kg/min (at max HR)  Stress Test Technologist: Cathlyn Parsons, RN  Nuclear Technologist:  {CHL LB NUCLEAR TECHNOLOGIST:21021025}     Rest Procedure:  Myocardial perfusion imaging  was performed at rest 45 minutes following the intravenous administration of Technetium 57m Sestamibi Rest ECG: Sinus Rhythm  Stress Procedure:  The patient received IV Lexiscan 0.4 mg over 15-seconds with concurrent low level exercise and then Technetium 75m Sestamibi was injected at 30-seconds while the patient continued walking one more minute. Patient had SOB with infusion.There were no significant changes with Lexiscan. Quantitative spect images were obtained after a 45-minute delay. Stress ECG: {CHL CAR STRESS ECG:21561}  QPS Raw Data Images:  {CHL RAW DATA IMAGES NUC:21021029} Stress Images:  {CHL STRESS IMAGES NUC:21021030} Rest Images:  {CHL REST IMAGES NUC:21021031} Subtraction (SDS):  {CHL SUBTRACTION (SDS) NUC:21021032} Transient Ischemic Dilatation (Normal <1.22):  *** Lung/Heart Ratio (Normal <0.45):  ***  Quantitative Gated Spect Images QGS EDV:  *** ml QGS ESV:  *** ml  Impression Exercise Capacity:  {CHL EXERCISE CAPACITY NUC:21021037} BP Response:  {CHL BP RESPONSE NUC:21021038} Clinical Symptoms:  {CHL CLINICAL SYMPTOMS NUC:21021039} ECG Impression:  {CHL ECG IMPRESSION NUC:21021040} Comparison with Prior Nuclear Study: No images to compare  Overall Impression:  {CHL OVERALL IMPRESSION NUC:21021041}  LV Ejection Fraction: {CHL CARD STUDY NOT GATED:21592:o}.  LV Wall Motion:  {CHL CARD QGS:21591:o}

## 2012-03-09 NOTE — Progress Notes (Signed)
[facility] SITE 3 NUCLEAR MED [address] [place] [address] [phone]  Cardiology Nuclear Med Study  Kayla Graham is a 46 y.o. female     MRN : [id number]     DOB: [birth year]  Procedure Date: 03/09/2012  Nuclear Med Background Indication for Stress Test:  Evaluation for Ischemia History:  Abnormal EKG:8/13 Nonspecific ST abnormalities(no change since 2008) and '08 Heart Catheterization: EF= 65% and normal coronaries Cardiac Risk Factors: Family History - CAD, History of Smoking and Hypertension  Symptoms:  Chest Pain/Tightness at Rest and  with Exertion (last date of chest discomfort/tightness yesterday), Diaphoresis, Dizziness, DOE, Fatigue, Near Syncope and SOB   Nuclear Pre-Procedure Caffeine/Decaff Intake:  9:00pm NPO After: 9:00pm   Lungs:  clear O2 Sat: 96% on room air. IV 0.9% NS with Angio Cath:  20g  IV Site: R Hand  IV Started by:  Cathlyn Parsons, RN  Chest Size (in):  40 Cup Size: D  Height: 5\' 5"  (1.651 m)  Weight:  231 lb (104.781 kg)  BMI:  Body mass index is 38.44 kg/(m^2). Tech Comments:  Inderal held x 36 hrs    Nuclear Med Study 1 or 2 day study: 2 day  Stress Test Type:  Treadmill/Lexiscan  Reading MD: Tinnie Gens Edgerrin Correia,M.D. Order Authorizing Provider:  Jannette Spanner  Resting Radionuclide: Technetium 39m Sestamibi  Resting Radionuclide Dose: 33.0 mCi  On      03-11-12  Stress Radionuclide:  Technetium 88m Sestamibi  Stress Radionuclide Dose: 33.0 mCi  On        03-09-12          Stress Protocol Rest HR: 67 Stress HR: 101  Rest BP: 125/83 Stress BP: 134/93  Exercise Time (min): 2:00 METS: 1.6   Predicted Max HR: 174 bpm % Max HR: 58.05 bpm Rate Pressure Product: 21308   Dose of Adenosine (mg):  n/a Dose of Lexiscan: 0.4 mg  Dose of Atropine (mg): n/a Dose of Dobutamine: n/a mcg/kg/min (at max HR)  Stress Test Technologist: Cathlyn Parsons, RN  Nuclear Technologist:  Domenic Polite, CNMT     Rest Procedure:   Myocardial perfusion imaging was performed at rest 45 minutes following the intravenous administration of Technetium 31m Sestamibi Rest ECG: Sinus Rhythm  Stress Procedure:  The patient received IV Lexiscan 0.4 mg over 15-seconds with concurrent low level exercise and then Technetium 23m Sestamibi was injected at 30-seconds while the patient continued walking one more minute. Patient had SOB with infusion.There were no significant changes with Lexiscan. Quantitative spect images were obtained after a 45-minute delay. Stress ECG: No significant ST segment change suggestive of ischemia.  QPS Raw Data Images:  Normal; no motion artifact; normal heart/lung ratio. Stress Images:  Normal homogeneous uptake in all areas of the myocardium. Rest Images:  Normal homogeneous uptake in all areas of the myocardium. Subtraction (SDS):  No evidence of ischemia. Transient Ischemic Dilatation (Normal <1.22):  1.10 Lung/Heart Ratio (Normal <0.45):  0.24  Quantitative Gated Spect Images QGS EDV:  90 ml QGS ESV:  25 ml  Impression Exercise Capacity:  Lexiscan with low level exercise. BP Response:  Normal blood pressure response. Clinical Symptoms:  shortness of breath ECG Impression:  No significant ST segment change suggestive of ischemia. Comparison with Prior Nuclear Study: No images to compare  Overall Impression:  Normal stress nuclear study.  LV Ejection Fraction: 72%.  LV Wall Motion:  Normal Wall Motion  Willa Rough, MD

## 2012-03-11 ENCOUNTER — Ambulatory Visit (HOSPITAL_COMMUNITY): Payer: 59 | Attending: Cardiology | Admitting: Radiology

## 2012-03-11 DIAGNOSIS — R0989 Other specified symptoms and signs involving the circulatory and respiratory systems: Secondary | ICD-10-CM

## 2012-03-11 MED ORDER — TECHNETIUM TC 99M SESTAMIBI GENERIC - CARDIOLITE
33.0000 | Freq: Once | INTRAVENOUS | Status: AC | PRN
  Administered 2012-03-11: 33 via INTRAVENOUS

## 2012-03-12 NOTE — Addendum Note (Signed)
Addended by: Domenic Polite on: 03/12/2012 09:27 AM   Modules accepted: Orders

## 2012-03-17 ENCOUNTER — Encounter: Payer: Self-pay | Admitting: Cardiovascular Disease

## 2012-08-15 ENCOUNTER — Other Ambulatory Visit: Payer: Self-pay

## 2013-05-06 ENCOUNTER — Other Ambulatory Visit: Payer: Self-pay

## 2013-05-10 ENCOUNTER — Other Ambulatory Visit: Payer: Self-pay | Admitting: Family Medicine

## 2013-05-17 ENCOUNTER — Encounter: Payer: Self-pay | Admitting: Family Medicine

## 2013-05-20 ENCOUNTER — Other Ambulatory Visit: Payer: Self-pay | Admitting: *Deleted

## 2013-05-20 NOTE — Telephone Encounter (Signed)
The patient's pharmacy is [facility] outpatient [facility]. She made an appointment for an OV up front.

## 2013-06-01 ENCOUNTER — Other Ambulatory Visit: Payer: Self-pay | Admitting: *Deleted

## 2013-06-01 ENCOUNTER — Telehealth: Payer: Self-pay | Admitting: Family Medicine

## 2013-06-01 MED ORDER — PROPRANOLOL HCL ER 60 MG PO CP24: 60.0000 mg | ORAL_CAPSULE | Freq: Every evening | ORAL | Status: DC

## 2013-06-01 NOTE — Telephone Encounter (Signed)
Med refilled. Pt notified.

## 2013-06-01 NOTE — Telephone Encounter (Signed)
Patient says that she spoke with someone regarding getting her propanolol filled. She was told she had to make an appointment for this, which she did. But she also said she thought we were going to call in a refill for this. She is completely out.   [facility]

## 2013-06-08 ENCOUNTER — Encounter: Payer: Self-pay | Admitting: Family Medicine

## 2013-06-08 ENCOUNTER — Ambulatory Visit (INDEPENDENT_AMBULATORY_CARE_PROVIDER_SITE_OTHER): Payer: 59 | Admitting: Family Medicine

## 2013-06-08 VITALS — BP 122/88 | Ht 65.0 in | Wt 211.0 lb

## 2013-06-08 DIAGNOSIS — E039 Hypothyroidism, unspecified: Secondary | ICD-10-CM | POA: Insufficient documentation

## 2013-06-08 DIAGNOSIS — Z79899 Other long term (current) drug therapy: Secondary | ICD-10-CM

## 2013-06-08 DIAGNOSIS — Z0000 Encounter for general adult medical examination without abnormal findings: Secondary | ICD-10-CM

## 2013-06-08 MED ORDER — LEVOTHYROXINE SODIUM 25 MCG PO TABS: ORAL_TABLET | ORAL | Status: DC

## 2013-06-08 MED ORDER — PROPRANOLOL HCL ER 60 MG PO CP24: 60.0000 mg | ORAL_CAPSULE | Freq: Every evening | ORAL | Status: DC

## 2013-06-08 NOTE — Progress Notes (Signed)
   Subjective:    Patient ID: Kayla Graham, female    DOB: 06/13/1966, 47 y.o.   MRN: [id number]  HPIFollow up on medication. No concerns. Has had flu vaccine.  She denies any particular problems currently she uses trazodone for insomnia she also uses her thyroid medicine as directed. Takes propranolol extended release 4 blood pressure  Past medical history benign social history she smokes she's been counseled to quit Review of Systems She denies headaches chest pain shortness breath vomiting diarrhea fevers or chills    Objective:   Physical Exam  Lungs are clear hearts regular neck no masses pulses are normal skin warm dry      Assessment & Plan:  #1 patient was advised to quit smoking and Y. #2 blood pressure under good control but she needs to check her blood pressure with her cuff outside the office and send [place] the readings every now and then it's borderline am concerned if not in the direction it should be continue current medication #3 hypothyroidism check lab work await the results of this. Patient also encouraged to continue try to lose weight

## 2013-06-11 LAB — LIPID PANEL
HDL: 35 mg/dL — ABNORMAL LOW (ref >39)
LDL Cholesterol: 74 mg/dL (ref 0–99)
Total CHOL/HDL Ratio: 4 Ratio
Triglycerides: 151 mg/dL — ABNORMAL HIGH (ref <150)
VLDL: 30 mg/dL (ref 0–40)

## 2013-06-11 LAB — BASIC METABOLIC PANEL
CO2: 26 mEq/L (ref 19–32)
Chloride: 103 mEq/L (ref 96–112)
Creat: 0.88 mg/dL (ref 0.50–1.10)
Potassium: 4.4 mEq/L (ref 3.5–5.3)
Sodium: 137 mEq/L (ref 135–145)

## 2013-06-11 LAB — TSH: TSH: 2.261 u[IU]/mL (ref 0.350–4.500)

## 2013-06-12 IMAGING — CR DG CHEST 2V
2 series · 2 of 2 positions shown · non-contrast
Comparison: 05/19/2006

CLINICAL DATA: Dyspnea

CHEST - 2 VIEW

[view not recorded (1 of 2)]
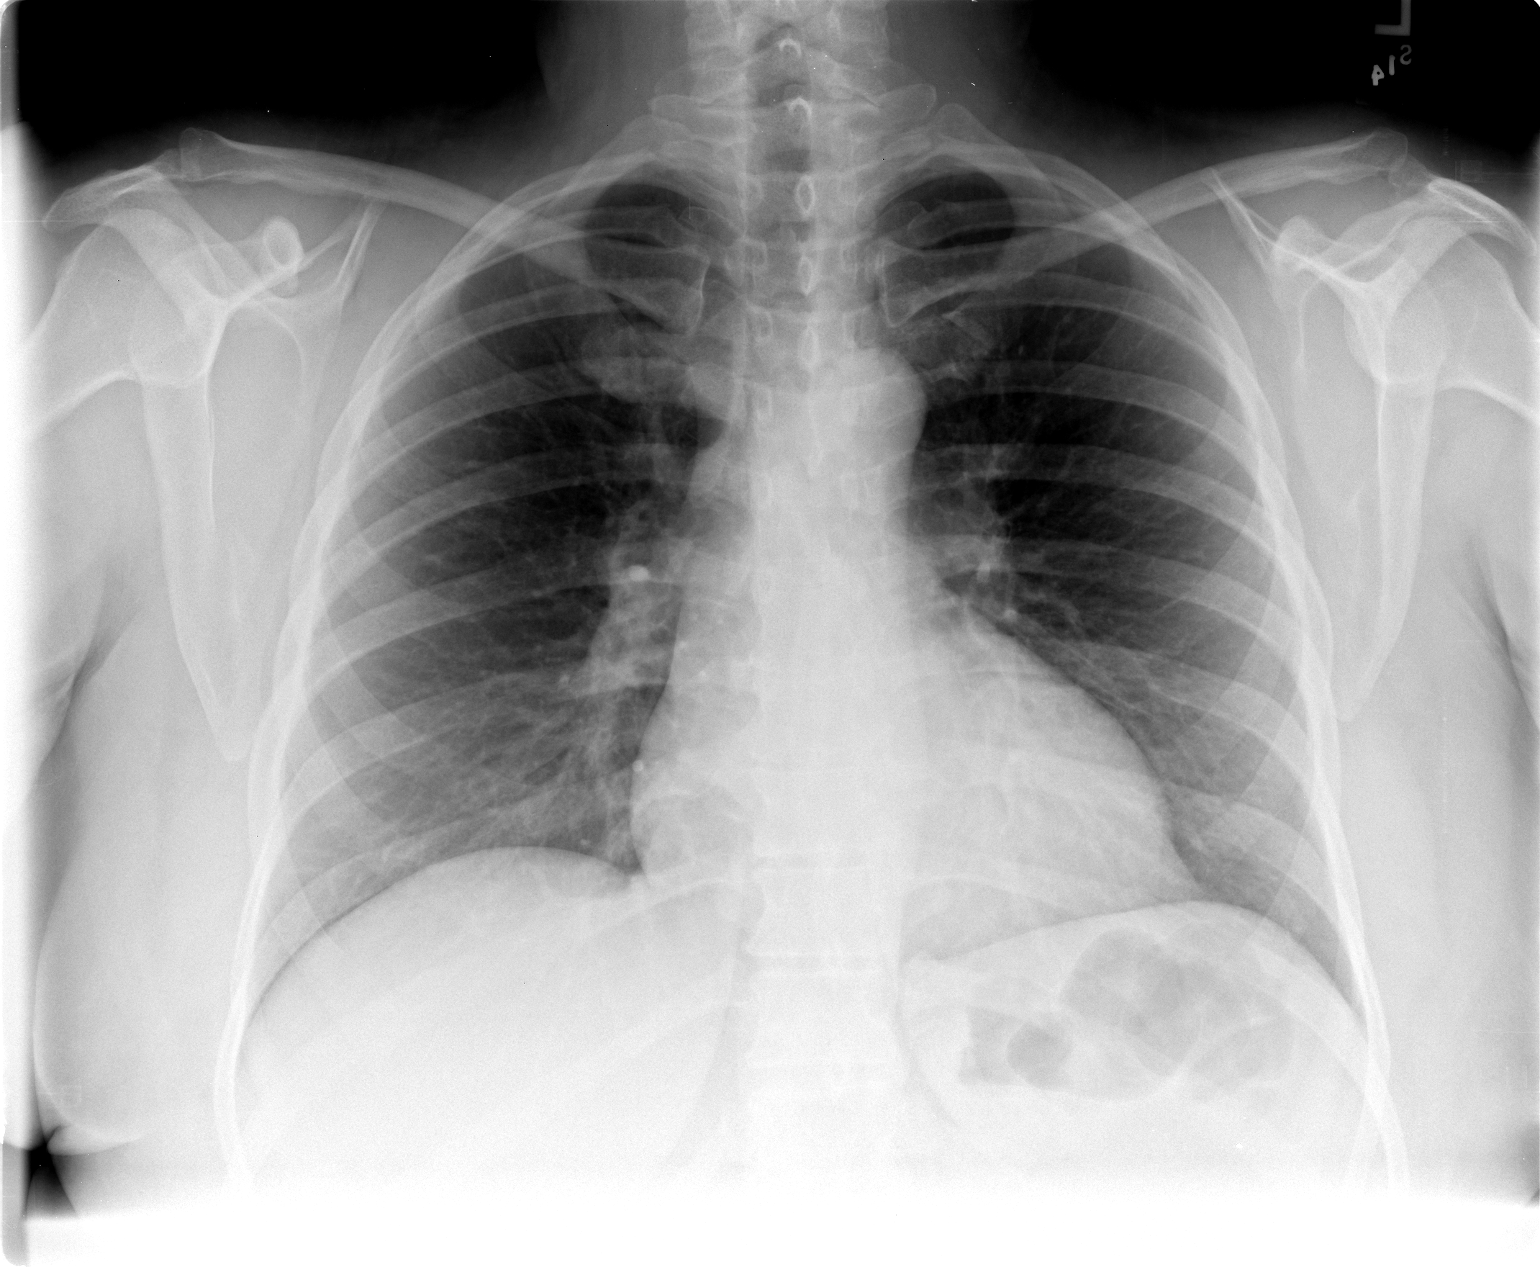

[view not recorded (2 of 2)]
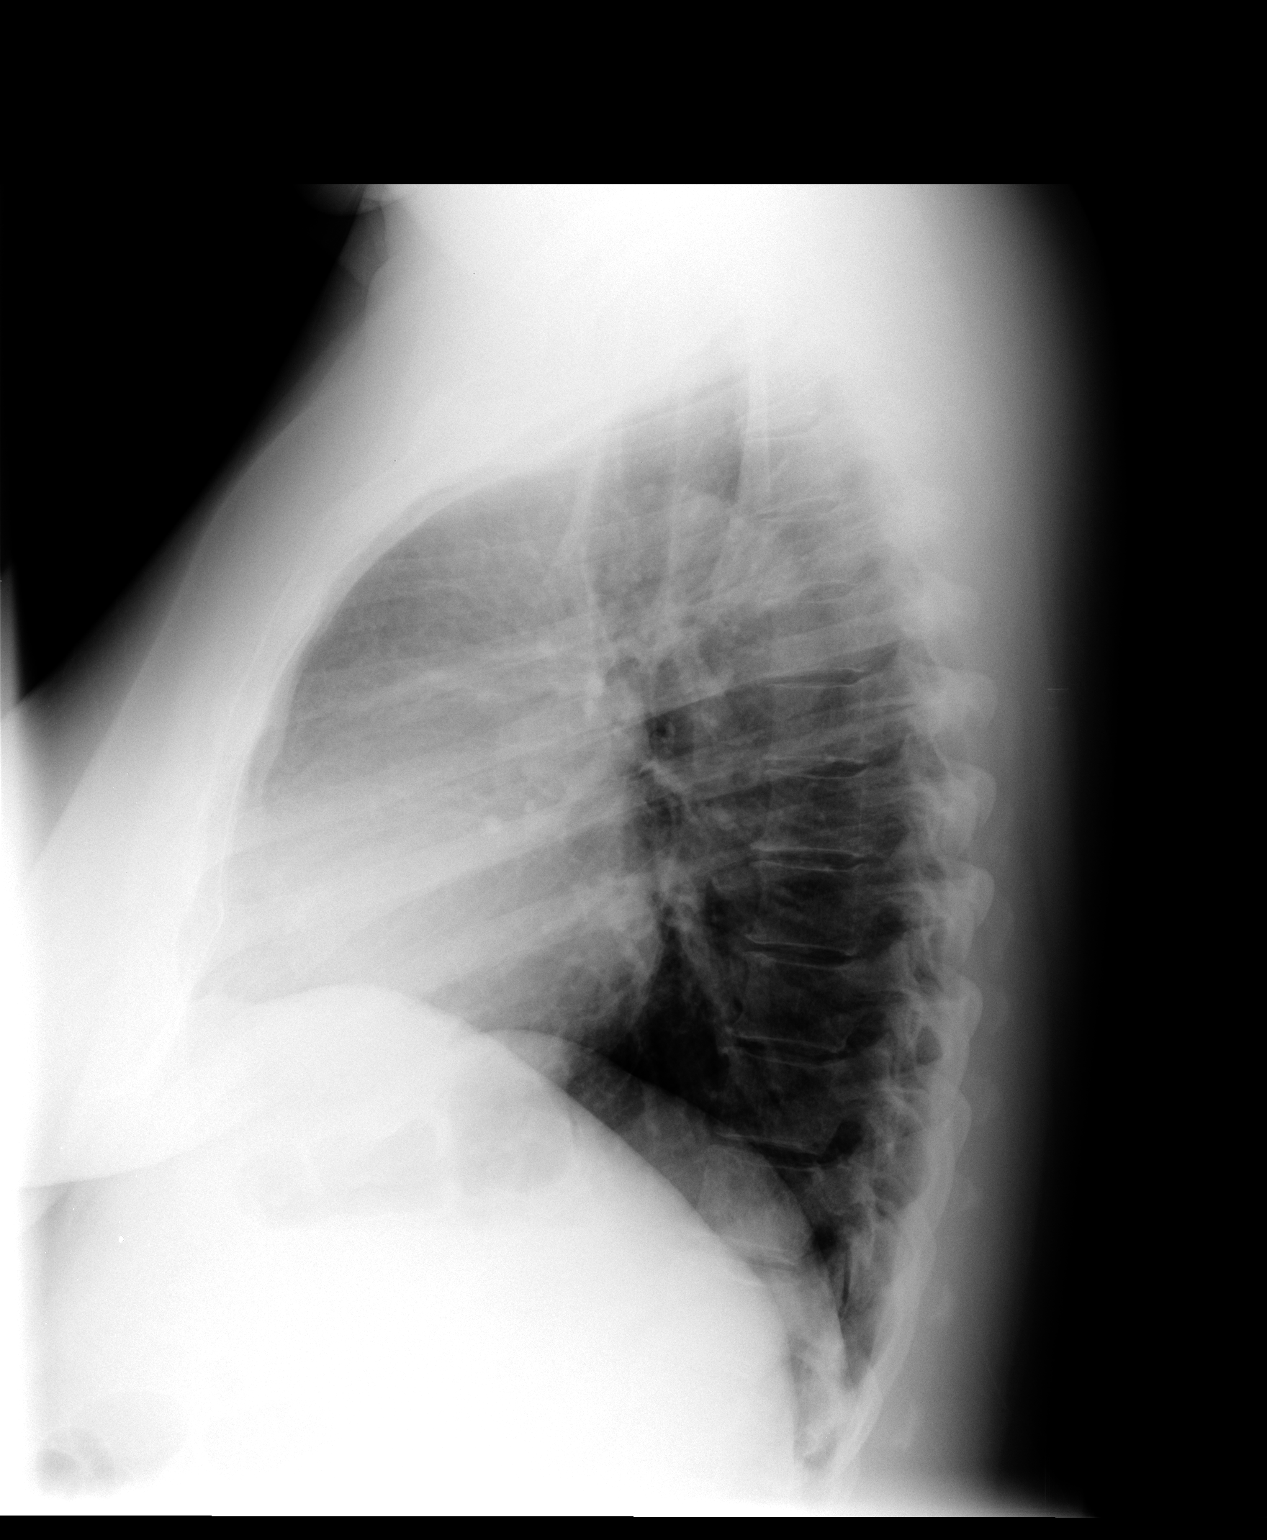

[2 of 2 positions shown; findings below may reference images not displayed]

FINDINGS: Cardiomediastinal silhouette is stable.  No acute
infiltrate or pleural effusion.  No pulmonary edema. Minimal
degenerative changes mid thoracic spine.
IMPRESSION: No active disease.

## 2013-06-13 ENCOUNTER — Encounter: Payer: Self-pay | Admitting: Family Medicine

## 2013-06-16 IMAGING — NM NM MISC PROCEDURE
1 series · 6 of 6 positions shown · non-contrast
Comparison: none

[Series 1: raw gspect · 6.40mm/px · 6 of 511 frames shown]
[frame 43/511]
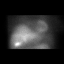
[frame 128/511]
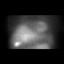
[frame 213/511]
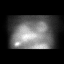
[frame 298/511]
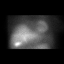
[frame 383/511]
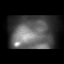
[frame 469/511]
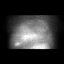

[6 of 6 positions shown; findings below may reference images not displayed]

Canned report from images found in remote index.

Refer to host system for actual result text.

## 2013-06-18 IMAGING — NM NM MISC PROCEDURE
1 series · 6 of 6 positions shown · non-contrast
Comparison: none

[Series 1: rest raw · 6.40mm/px · 6 of 64 frames shown]
[frame 6/64]
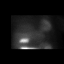
[frame 16/64]
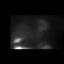
[frame 27/64]
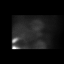
[frame 38/64]
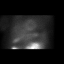
[frame 48/64]
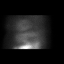
[frame 59/64]
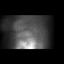

[6 of 6 positions shown; findings below may reference images not displayed]

Canned report from images found in remote index.

Refer to host system for actual result text.

## 2013-09-03 ENCOUNTER — Encounter: Payer: Self-pay | Admitting: Nurse Practitioner

## 2013-09-03 ENCOUNTER — Ambulatory Visit (INDEPENDENT_AMBULATORY_CARE_PROVIDER_SITE_OTHER): Payer: 59 | Admitting: Nurse Practitioner

## 2013-09-03 ENCOUNTER — Encounter: Payer: Self-pay | Admitting: Family Medicine

## 2013-09-03 VITALS — BP 128/82 | Ht 65.0 in | Wt 210.6 lb

## 2013-09-03 DIAGNOSIS — F32A Depression, unspecified: Secondary | ICD-10-CM

## 2013-09-03 DIAGNOSIS — G4700 Insomnia, unspecified: Secondary | ICD-10-CM

## 2013-09-03 DIAGNOSIS — F3289 Other specified depressive episodes: Secondary | ICD-10-CM

## 2013-09-03 DIAGNOSIS — F329 Major depressive disorder, single episode, unspecified: Secondary | ICD-10-CM

## 2013-09-03 MED ORDER — ALPRAZOLAM 0.5 MG PO TABS: 0.5000 mg | ORAL_TABLET | Freq: Every evening | ORAL | Status: DC | PRN

## 2013-09-03 MED ORDER — FLUOXETINE HCL 20 MG PO TABS: ORAL_TABLET | ORAL | Status: DC

## 2013-09-04 ENCOUNTER — Encounter: Payer: Self-pay | Admitting: Nurse Practitioner
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
## 2013-10-01 ENCOUNTER — Encounter: Payer: Self-pay | Admitting: Nurse Practitioner

## 2013-10-01 ENCOUNTER — Ambulatory Visit (INDEPENDENT_AMBULATORY_CARE_PROVIDER_SITE_OTHER): Payer: 59 | Admitting: Nurse Practitioner

## 2013-10-01 ENCOUNTER — Encounter: Payer: Self-pay | Admitting: Family Medicine

## 2013-10-01 VITALS — BP 118/88 | Ht 65.0 in | Wt 211.0 lb

## 2013-10-01 DIAGNOSIS — F411 Generalized anxiety disorder: Secondary | ICD-10-CM | POA: Insufficient documentation

## 2013-10-01 MED ORDER — FLUOXETINE HCL 20 MG PO TABS: ORAL_TABLET | ORAL | Status: DC

## 2013-10-04 ENCOUNTER — Encounter: Payer: Self-pay | Admitting: Nurse Practitioner

## 2013-10-04 NOTE — Progress Notes (Signed)
Subjective:  Presents for recheck on her anxiety symptoms. Has started mental health counseling. Feels somewhat improved on Prozac. Limiting her use of Xanax. Continues to have early morning awakenings, has tried trazodone in the past which did not help her sleep and cause grogginess the next morning. Continues to experience social isolation. Difficulty focusing and short-term memory loss. Denies suicidal or homicidal thoughts or ideation.  Objective:   BP 118/88  Ht 5\' 5"  (1.651 m)  Wt 211 lb (95.709 kg)  BMI 35.11 kg/m2 NAD. Alert, oriented. Thoughts logical coherent and relevant. Dressed appropriately. Moderate anxiety symptoms noted such as crying and tremors at times. Lungs clear. Heart regular rate rhythm.  Assessment:  Problem List Items Addressed This Visit     Other   Anxiety state, unspecified - Primary   Relevant Medications      FLUoxetine (PROZAC) tablet     Plan: Meds ordered this encounter  Medications  . FLUoxetine (PROZAC) 20 MG tablet    Sig: 3 po qd    Dispense:  90 tablet    Refill:  1    Order Specific Question:  Supervising Provider    Answer:  Merlyn AlbertLUKING, WILLIAM S [[id number]]   Increased Prozac dose to 60 mg daily. Patient given work note for another month, note she has a very high stress position. Continue counseling.  Return in about 1 month (around 10/31/2013). Call back sooner or seek help immediately if any suicidal thoughts.

## 2013-10-19 DIAGNOSIS — Z0289 Encounter for other administrative examinations: Secondary | ICD-10-CM

## 2013-10-26 ENCOUNTER — Encounter: Payer: Self-pay | Admitting: Nurse Practitioner

## 2013-10-26 ENCOUNTER — Other Ambulatory Visit: Payer: Self-pay | Admitting: Nurse Practitioner

## 2013-10-26 MED ORDER — PROPRANOLOL HCL ER 60 MG PO CP24: 60.0000 mg | ORAL_CAPSULE | Freq: Every evening | ORAL | Status: DC

## 2013-10-26 MED ORDER — FLUOXETINE HCL 20 MG PO TABS: ORAL_TABLET | ORAL | Status: DC

## 2013-10-26 MED ORDER — LEVOTHYROXINE SODIUM 25 MCG PO TABS: ORAL_TABLET | ORAL | Status: DC

## 2013-10-26 NOTE — Telephone Encounter (Signed)
Seen 4/3

## 2013-11-01 ENCOUNTER — Ambulatory Visit: Payer: 59 | Admitting: Nurse Practitioner

## 2013-11-08 ENCOUNTER — Encounter: Payer: Self-pay | Admitting: Nurse Practitioner

## 2013-11-08 ENCOUNTER — Ambulatory Visit (INDEPENDENT_AMBULATORY_CARE_PROVIDER_SITE_OTHER): Payer: 59 | Admitting: Nurse Practitioner

## 2013-11-08 VITALS — BP 112/72 | Ht 65.0 in | Wt 213.0 lb

## 2013-11-08 DIAGNOSIS — F411 Generalized anxiety disorder: Secondary | ICD-10-CM

## 2013-11-08 MED ORDER — ALPRAZOLAM 0.5 MG PO TABS: 0.5000 mg | ORAL_TABLET | Freq: Every evening | ORAL | Status: DC | PRN

## 2013-11-10 ENCOUNTER — Encounter: Payer: Self-pay | Admitting: Nurse Practitioner

## 2013-11-10 NOTE — Progress Notes (Signed)
Subjective:  Presents for recheck. Anxiety much improved on daily Prozac 20 mg. Has also started counseling which has helped. Takes an occas Xanax, tries not to take them too often. Daughter present today; has seen improvement.  Objective:   BP 112/72  Ht 5\' 5"  (1.651 m)  Wt 213 lb (96.616 kg)  BMI 35.44 kg/m2 NAD. Alert,oriented. Cheerful affect. Lungs clear. Heart RRR.  Assessment:  Problem List Items Addressed This Visit     Other   Anxiety state, unspecified - Primary   Relevant Medications      ALPRAZolam Prudy Feeler(XANAX) tablet     Plan:  Meds ordered this encounter  Medications  . ALPRAZolam (XANAX) 0.5 MG tablet    Sig: Take 1 tablet (0.5 mg total) by mouth at bedtime as needed.    Dispense:  30 tablet    Refill:  2    Order Specific Question:  Supervising Provider    Answer:  Merlyn AlbertLUKING, WILLIAM S [[id number]]   Continue Prozac as directed.  Return in about 3 months (around 02/08/2014). Call back sooner if any problems.

## 2014-02-08 ENCOUNTER — Ambulatory Visit: Payer: 59 | Admitting: Nurse Practitioner

## 2014-05-21 ENCOUNTER — Other Ambulatory Visit: Payer: Self-pay | Admitting: Nurse Practitioner

## 2014-07-05 ENCOUNTER — Telehealth: Payer: Self-pay | Admitting: Family Medicine

## 2014-07-05 ENCOUNTER — Other Ambulatory Visit: Payer: Self-pay | Admitting: Nurse Practitioner

## 2014-07-05 MED ORDER — FLUOXETINE HCL 20 MG PO TABS: ORAL_TABLET | ORAL | Status: DC

## 2014-07-05 MED ORDER — ALPRAZOLAM 0.5 MG PO TABS: 0.5000 mg | ORAL_TABLET | Freq: Every evening | ORAL | Status: DC | PRN

## 2014-07-05 NOTE — Telephone Encounter (Signed)
ALPRAZolam (XANAX) 0.5 MG tablet FLUoxetine (PROZAC) 20 MG tablet    Pt needs refill on these two meds please   Last seen 11/08/13

## 2014-07-05 NOTE — Telephone Encounter (Signed)
Last seen 11/08/13 and told to come back in 3 months.

## 2014-07-05 NOTE — Telephone Encounter (Signed)
Please advise patient she must come in within the next month to receive more refills. I will send in one more 30 day refill. Thanks.

## 2014-07-06 NOTE — Telephone Encounter (Signed)
Patients husband notified and verbalized understanding 

## 2014-08-11 ENCOUNTER — Encounter: Payer: Self-pay | Admitting: Family Medicine

## 2014-08-11 ENCOUNTER — Ambulatory Visit (INDEPENDENT_AMBULATORY_CARE_PROVIDER_SITE_OTHER): Payer: Managed Care, Other (non HMO) | Admitting: Family Medicine

## 2014-08-11 VITALS — BP 132/94 | Ht 65.0 in | Wt 212.0 lb

## 2014-08-11 DIAGNOSIS — Z13220 Encounter for screening for lipoid disorders: Secondary | ICD-10-CM

## 2014-08-11 DIAGNOSIS — R739 Hyperglycemia, unspecified: Secondary | ICD-10-CM

## 2014-08-11 DIAGNOSIS — E039 Hypothyroidism, unspecified: Secondary | ICD-10-CM

## 2014-08-11 DIAGNOSIS — R002 Palpitations: Secondary | ICD-10-CM

## 2014-08-11 DIAGNOSIS — Z79899 Other long term (current) drug therapy: Secondary | ICD-10-CM

## 2014-08-11 MED ORDER — ALPRAZOLAM 0.5 MG PO TABS: 0.5000 mg | ORAL_TABLET | Freq: Every evening | ORAL | Status: DC | PRN

## 2014-08-11 MED ORDER — FLUOXETINE HCL 20 MG PO TABS: ORAL_TABLET | ORAL | Status: DC

## 2014-08-11 MED ORDER — METOPROLOL TARTRATE 25 MG PO TABS: 25.0000 mg | ORAL_TABLET | Freq: Two times a day (BID) | ORAL | Status: DC

## 2014-08-11 MED ORDER — LEVOTHYROXINE SODIUM 25 MCG PO TABS: 25.0000 ug | ORAL_TABLET | Freq: Every day | ORAL | Status: DC

## 2014-08-11 NOTE — Progress Notes (Signed)
   Subjective:    Patient ID: Kayla Graham, female    DOB: Nov 12, 1965, 49 y.o.   MRN: [id number]  HPIMed check up.  Would like propranolol ER 60mg  changed to something cheaper. Has been out of med for about 1 month.  No regular exercise. Just normal house work.  Could do better with diet. Does not eat breaksfast or lunch. Eats protein bars.  Patient has history of palpitations cannot tolerate the high cost of her current medicine She does have some family history of obesity and also diabetes. She has had borderline hyperglycemia in the past she tries to minimize starches in her diet She has been taking her depression medicine states that it is helping her. She relates she uses Xanax rarely. She takes her thyroid medicine as directed Review of Systems     Objective:   Physical Exam Lungs are clear hearts regular pulse normal extremities no edema skin warm dry blood pressure checked twice normal       Assessment & Plan:  History palpitations-tachycardia unable to afford current medication try metoprolol 25 mg twice a day Borderline HTN minimize salt walk exercise reduce weight Follow blood pressures intermittently Hypothyroidism take medication check TSH Depression anxiety stable requests refills refills given Borderline hyperglycemia check lab work. Follow-up 6 months

## 2014-08-11 NOTE — Patient Instructions (Signed)
DASH Eating Plan °DASH stands for "Dietary Approaches to Stop Hypertension." The DASH eating plan is a healthy eating plan that has been shown to reduce high blood pressure (hypertension). Additional health benefits may include reducing the risk of type 2 diabetes mellitus, heart disease, and stroke. The DASH eating plan may also help with weight loss. °WHAT DO I NEED TO KNOW ABOUT THE DASH EATING PLAN? °For the DASH eating plan, you will follow these general guidelines: °· Choose foods with a percent daily value for sodium of less than 5% (as listed on the food label). °· Use salt-free seasonings or herbs instead of table salt or sea salt. °· Check with your health care provider or pharmacist before using salt substitutes. °· Eat lower-sodium products, often labeled as "lower sodium" or "no salt added." °· Eat fresh foods. °· Eat more vegetables, fruits, and low-fat dairy products. °· Choose whole grains. Look for the word "whole" as the first word in the ingredient list. °· Choose fish and skinless chicken or turkey more often than red meat. Limit fish, poultry, and meat to 6 oz (170 g) each day. °· Limit sweets, desserts, sugars, and sugary drinks. °· Choose heart-healthy fats. °· Limit cheese to 1 oz (28 g) per day. °· Eat more home-cooked food and less restaurant, buffet, and fast food. °· Limit fried foods. °· Cook foods using methods other than frying. °· Limit canned vegetables. If you do use them, rinse them well to decrease the sodium. °· When eating at a restaurant, ask that your food be prepared with less salt, or no salt if possible. °WHAT FOODS CAN I EAT? °Seek help from a dietitian for individual calorie needs. °Grains °Whole grain or whole wheat bread. Brown rice. Whole grain or whole wheat pasta. Quinoa, bulgur, and whole grain cereals. Low-sodium cereals. Corn or whole wheat flour tortillas. Whole grain cornbread. Whole grain crackers. Low-sodium crackers. °Vegetables °Fresh or frozen vegetables  (raw, steamed, roasted, or grilled). Low-sodium or reduced-sodium tomato and vegetable juices. Low-sodium or reduced-sodium tomato sauce and paste. Low-sodium or reduced-sodium canned vegetables.  °Fruits °All fresh, canned (in natural juice), or frozen fruits. °Meat and Other Protein Products °Ground beef (85% or leaner), grass-fed beef, or beef trimmed of fat. Skinless chicken or turkey. Ground chicken or turkey. Pork trimmed of fat. All fish and seafood. Eggs. Dried beans, peas, or lentils. Unsalted nuts and seeds. Unsalted canned beans. °Dairy °Low-fat dairy products, such as skim or 1% milk, 2% or reduced-fat cheeses, low-fat ricotta or cottage cheese, or plain low-fat yogurt. Low-sodium or reduced-sodium cheeses. °Fats and Oils °Tub margarines without trans fats. Light or reduced-fat mayonnaise and salad dressings (reduced sodium). Avocado. Safflower, olive, or canola oils. Natural peanut or almond butter. °Other °Unsalted popcorn and pretzels. °The items listed above may not be a complete list of recommended foods or beverages. Contact your dietitian for more options. °WHAT FOODS ARE NOT RECOMMENDED? °Grains °White bread. White pasta. White rice. Refined cornbread. Bagels and croissants. Crackers that contain trans fat. °Vegetables °Creamed or fried vegetables. Vegetables in a cheese sauce. Regular canned vegetables. Regular canned tomato sauce and paste. Regular tomato and vegetable juices. °Fruits °Dried fruits. Canned fruit in light or heavy syrup. Fruit juice. °Meat and Other Protein Products °Fatty cuts of meat. Ribs, chicken wings, bacon, sausage, bologna, salami, chitterlings, fatback, hot dogs, bratwurst, and packaged luncheon meats. Salted nuts and seeds. Canned beans with salt. °Dairy °Whole or 2% milk, cream, half-and-half, and cream cheese. Whole-fat or sweetened yogurt. Full-fat   cheeses or blue cheese. Nondairy creamers and whipped toppings. Processed cheese, cheese spreads, or cheese  curds. °Condiments °Onion and garlic salt, seasoned salt, table salt, and sea salt. Canned and packaged gravies. Worcestershire sauce. Tartar sauce. Barbecue sauce. Teriyaki sauce. Soy sauce, including reduced sodium. Steak sauce. Fish sauce. Oyster sauce. Cocktail sauce. Horseradish. Ketchup and mustard. Meat flavorings and tenderizers. Bouillon cubes. Hot sauce. Tabasco sauce. Marinades. Taco seasonings. Relishes. °Fats and Oils °Butter, stick margarine, lard, shortening, ghee, and bacon fat. Coconut, palm kernel, or palm oils. Regular salad dressings. °Other °Pickles and olives. Salted popcorn and pretzels. °The items listed above may not be a complete list of foods and beverages to avoid. Contact your dietitian for more information. °WHERE CAN I FIND MORE INFORMATION? °[employer]: www.nhlbi.nih.gov/health/health-topics/topics/dash/ °Document Released: 06/06/2011 Document Revised: 11/01/2013 Document Reviewed: 04/21/2013 °[employer]® Patient Information ©2015 [employer]. This information is not intended to replace advice given to you by your health care provider. Make sure you discuss any questions you have with your health care provider. ° °

## 2014-08-16 ENCOUNTER — Telehealth: Payer: Self-pay | Admitting: Family Medicine

## 2014-08-16 ENCOUNTER — Other Ambulatory Visit: Payer: Self-pay | Admitting: *Deleted

## 2014-08-16 MED ORDER — CITALOPRAM HYDROBROMIDE 40 MG PO TABS: 40.0000 mg | ORAL_TABLET | Freq: Every day | ORAL | Status: DC

## 2014-08-16 NOTE — Telephone Encounter (Signed)
FLUoxetine (PROZAC) 20 MG tablet  This med was issued, but due to the cost they can't afford it Their insurance told them they can do Citalopram for a Lower cost.   Can we [place] in to [facility] [place]

## 2014-08-16 NOTE — Telephone Encounter (Signed)
celexa 40 1 qd, 30, 4 refills ( 40 of  Celexa is equal to 60 of prozac)

## 2014-08-16 NOTE — Telephone Encounter (Signed)
Patient left a message on my VM on 08/12/14 requesting that a nurse call her back regarding a question with a medication.

## 2014-08-16 NOTE — Telephone Encounter (Signed)
See other phone note

## 2014-08-16 NOTE — Telephone Encounter (Signed)
Discussed with pt. Med sent to pharm.  

## 2015-02-05 ENCOUNTER — Other Ambulatory Visit: Payer: Self-pay | Admitting: Family Medicine

## 2015-02-06 NOTE — Telephone Encounter (Signed)
1 refill, send card for follow-up office visit

## 2015-05-13 ENCOUNTER — Other Ambulatory Visit: Payer: Self-pay | Admitting: Family Medicine

## 2015-05-15 NOTE — Telephone Encounter (Signed)
This am 2 refills then patient will need office visit

## 2015-06-08 ENCOUNTER — Other Ambulatory Visit: Payer: Self-pay | Admitting: Family Medicine

## 2015-06-09 ENCOUNTER — Other Ambulatory Visit: Payer: Self-pay | Admitting: Family Medicine

## 2015-07-10 ENCOUNTER — Other Ambulatory Visit: Payer: Self-pay | Admitting: Family Medicine

## 2015-07-10 NOTE — Telephone Encounter (Signed)
May have this and one additional refill, needs office visit

## 2015-07-12 ENCOUNTER — Other Ambulatory Visit: Payer: Self-pay | Admitting: Family Medicine

## 2015-09-05 ENCOUNTER — Other Ambulatory Visit: Payer: Self-pay | Admitting: Family Medicine

## 2015-09-17 ENCOUNTER — Other Ambulatory Visit: Payer: Self-pay | Admitting: Family Medicine

## 2015-10-05 ENCOUNTER — Other Ambulatory Visit: Payer: Self-pay | Admitting: Family Medicine

## 2015-10-11 ENCOUNTER — Other Ambulatory Visit: Payer: Self-pay | Admitting: Family Medicine

## 2015-12-02 ENCOUNTER — Other Ambulatory Visit: Payer: Self-pay | Admitting: Family Medicine
